# Patient Record
Sex: Female | Born: 1995 | Race: White | Hispanic: No | Marital: Single | Smoking: Never smoker
Health system: Southern US, Community
[De-identification: ages and names within clinical notes are randomized; demographics above are authoritative.]

## PROBLEM LIST (undated history)

## (undated) DIAGNOSIS — F988 Other specified behavioral and emotional disorders with onset usually occurring in childhood and adolescence: Secondary | ICD-10-CM

## (undated) HISTORY — DX: Other specified behavioral and emotional disorders with onset usually occurring in childhood and adolescence: F98.8

---

## 2015-05-12 ENCOUNTER — Other Ambulatory Visit: Payer: Self-pay | Admitting: Family Medicine

## 2015-05-12 ENCOUNTER — Ambulatory Visit
Admission: RE | Admit: 2015-05-12 | Discharge: 2015-05-12 | Disposition: A | Discharge status: Home / Self Care | Payer: BLUE CROSS/BLUE SHIELD | Source: Ambulatory Visit | Attending: Family Medicine | Admitting: Family Medicine

## 2015-05-12 DIAGNOSIS — M25552 Pain in left hip: Secondary | ICD-10-CM

## 2015-05-30 ENCOUNTER — Other Ambulatory Visit: Payer: Self-pay | Admitting: Family Medicine

## 2015-05-30 DIAGNOSIS — M25552 Pain in left hip: Secondary | ICD-10-CM

## 2015-06-05 ENCOUNTER — Ambulatory Visit
Admission: RE | Admit: 2015-06-05 | Discharge: 2015-06-05 | Disposition: A | Discharge status: Home / Self Care | Payer: BLUE CROSS/BLUE SHIELD | Source: Ambulatory Visit | Attending: Family Medicine | Admitting: Family Medicine

## 2015-06-05 DIAGNOSIS — S73192A Other sprain of left hip, initial encounter: Secondary | ICD-10-CM | POA: Diagnosis not present

## 2015-06-05 DIAGNOSIS — M25552 Pain in left hip: Secondary | ICD-10-CM | POA: Insufficient documentation

## 2015-06-05 DIAGNOSIS — Y9365 Activity, lacrosse and field hockey: Secondary | ICD-10-CM | POA: Insufficient documentation

## 2015-06-05 MED ORDER — IOPAMIDOL (ISOVUE-300) INJECTION 61%: 15.0000 mL | Freq: Once | INTRAVENOUS | Status: DC | PRN

## 2015-06-05 MED ORDER — GADOBENATE DIMEGLUMINE 529 MG/ML IV SOLN: 0.1000 mL | Freq: Once | INTRAVENOUS | Status: DC | PRN

## 2015-06-08 ENCOUNTER — Ambulatory Visit (INDEPENDENT_AMBULATORY_CARE_PROVIDER_SITE_OTHER): Payer: BLUE CROSS/BLUE SHIELD | Admitting: Family Medicine

## 2015-06-08 DIAGNOSIS — M25552 Pain in left hip: Secondary | ICD-10-CM

## 2015-06-16 NOTE — Progress Notes (Signed)
Patient ID: Marice PotterGracie Bursch, female   DOB: [birth year], 20 y.o.   MRN: [id number] Patient presents today with trainer to discuss results of her recent MR arthrogram. Patient states that her left hip is actually been feeling well. She has been able to participate and full capacity without having any significant pain. Trainer attests that she also has not been limited in practice or games.  ROS: Negative except mentioned above. Vitals as per Epic. GENERAL: NAD RESP: CTA B CARD: RRR MSK: L Hip-no tenderness to palpation, FROM, no significant hip flexor tightness, negative Faber, negative Fulcrum, negative log roll, nv intact  NEURO: CN II-XII grossly intact   A/P: L Hip Pain- MR arthrogram shows evidence of a labral tear, patient however is asymptomatic from this at this time and is able to participate without any problems. I've discussed the results with her and patient will proceed with seeing a hip specialist in the off season. If she has any return of symptoms regarding her hip she will follow-up with me and/or Dr. Ardine Engiehl.

## 2015-12-04 ENCOUNTER — Encounter: Payer: Self-pay | Admitting: Family Medicine

## 2015-12-04 ENCOUNTER — Ambulatory Visit (INDEPENDENT_AMBULATORY_CARE_PROVIDER_SITE_OTHER): Payer: Managed Care, Other (non HMO) | Admitting: Family Medicine

## 2015-12-04 VITALS — BP 92/62 | HR 96 | Temp 98.1°F | Resp 14

## 2015-12-04 DIAGNOSIS — R42 Dizziness and giddiness: Secondary | ICD-10-CM

## 2015-12-04 NOTE — Progress Notes (Signed)
Patient presents with symptoms of dizziness for a week. Patient denies any head injury. She denies CP, SOB, nausea, headache. Patient does admit that she did drink alcohol last weekend before the symptoms started on Monday and then again this past weekend. She has been practicing the whole week through the dizziness. More dizzy with standing up then lying down. Denies any syncopal episodes. She stopped taking her second dose of Adderall 10mg s because she thought it could be contributing. She denies any other illicit drug use, smoking. She tends to try to eat healthy and does not drink electrolyte beverages but takes electrolyte tablets and tries to drink plenty of water. Normal menstrual cycles.  ROS: Negative except mentioned above.  GENERAL: NAD HEENT: no pharyngeal erythema, no exudate, mild erythema of TMs, no bulging, no cervical LAD RESP: CTA B CARD: RRR NEURO: CN II-XII grossly intact, -Rombergs  A/P: Dizziness- EKG shows normal sinus rhythm with no significant abnormalities noted, urine pregnancy was negative, urine dip shows 2+ leukocytes will send for culture and some basic labs, she was orthostatic in the office but is able to tolerate oral fluids, encourage patient to stay out of the heat and not practice today and to rehydrate with water and electrolytes, would recommend seeing her back tomorrow and checking her vitals and symptoms again. If any new or worsening symptoms seek medical attention as discussed. No athletic activity for now. Discouraged drinking alcohol specially binge drinking. Patient denies having a problem with alcohol use.

## 2015-12-05 ENCOUNTER — Ambulatory Visit: Payer: Managed Care, Other (non HMO) | Admitting: Family Medicine

## 2015-12-05 LAB — CBC WITH DIFFERENTIAL/PLATELET
BASOS: 0 %
Basophils Absolute: 0 10*3/uL (ref 0.0–0.2)
EOS (ABSOLUTE): 0 10*3/uL (ref 0.0–0.4)
EOS: 0 %
HEMATOCRIT: 41.8 % (ref 34.0–46.6)
Hemoglobin: 13.8 g/dL (ref 11.1–15.9)
IMMATURE GRANS (ABS): 0 10*3/uL (ref 0.0–0.1)
IMMATURE GRANULOCYTES: 0 %
LYMPHS: 20 %
Lymphocytes Absolute: 1.7 10*3/uL (ref 0.7–3.1)
MCH: 31.2 pg (ref 26.6–33.0)
MCHC: 33 g/dL (ref 31.5–35.7)
MCV: 95 fL (ref 79–97)
Monocytes Absolute: 0.3 10*3/uL (ref 0.1–0.9)
Monocytes: 4 %
NEUTROS ABS: 6.3 10*3/uL (ref 1.4–7.0)
NEUTROS PCT: 76 %
PLATELETS: 287 10*3/uL (ref 150–379)
RBC: 4.42 x10E6/uL (ref 3.77–5.28)
RDW: 13.6 % (ref 12.3–15.4)
WBC: 8.4 10*3/uL (ref 3.4–10.8)

## 2015-12-05 LAB — TSH: TSH: 0.87 u[IU]/mL (ref 0.450–4.500)

## 2015-12-05 LAB — BASIC METABOLIC PANEL
BUN / CREAT RATIO: 14 (ref 9–23)
BUN: 12 mg/dL (ref 6–20)
CALCIUM: 9.4 mg/dL (ref 8.7–10.2)
CO2: 21 mmol/L (ref 18–29)
CREATININE: 0.83 mg/dL (ref 0.57–1.00)
Chloride: 99 mmol/L (ref 96–106)
GFR, EST AFRICAN AMERICAN: 117 mL/min/{1.73_m2} (ref >59)
GFR, EST NON AFRICAN AMERICAN: 102 mL/min/{1.73_m2} (ref >59)
Glucose: 139 mg/dL — ABNORMAL HIGH (ref 65–99)
Potassium: 4.2 mmol/L (ref 3.5–5.2)
Sodium: 137 mmol/L (ref 134–144)

## 2015-12-05 LAB — SPECIMEN STATUS REPORT

## 2015-12-08 LAB — POCT URINE PREGNANCY: PREG TEST UR: NEGATIVE

## 2016-04-12 ENCOUNTER — Encounter: Payer: Self-pay | Admitting: Family Medicine

## 2016-04-12 ENCOUNTER — Ambulatory Visit (INDEPENDENT_AMBULATORY_CARE_PROVIDER_SITE_OTHER): Payer: Managed Care, Other (non HMO) | Admitting: Family Medicine

## 2016-04-12 VITALS — BP 93/55 | HR 72 | Temp 98.0°F | Resp 14

## 2016-04-12 DIAGNOSIS — R21 Rash and other nonspecific skin eruption: Secondary | ICD-10-CM

## 2016-04-12 MED ORDER — PREDNISONE 20 MG PO TABS: ORAL_TABLET | ORAL | 0 refills | Status: AC

## 2016-04-12 NOTE — Progress Notes (Signed)
Patient presents today with symptoms of a erythematous rash on her entire body excluding her face. She states that the rash has been there for the last 2 days and has been itchy at times. She denies any itchiness in the webspaces of her fingers. She has used a new scrub on her body recently and has been taking a mushroom powder recently. She denies any other new medications. She denies any sore throat, fever, chest pain, shortness of breath, headache. She has not washed her clothes and any new detergents. She denies sleeping in any new beds. She denies any history of tick bites. She did take Claritin last night.  ROS: Negative except mentioned above. Vitals as per Epic. GENERAL: NAD HEENT: no pharyngeal erythema, no exudate, no erythema of TMs, no cervical LAD RESP: CTA B CARD: RRR SKIN: Very small erythematous slightly raised rash on extremities and torso area, also has some of the rash on her upper legs bilaterally, patient does have scabs on her hands from getting checked with Lacrosse, none of these are tender or look infected, no streaks appreciated from these areas, there is scarring on her lower extremities from bug bites in the past, none of these look infected NEURO: CN II-XII grossly intact   A/P: Skin rash - unsure of etiology however because the rash is itchy would recommend taking steroid and stopping mushroom powder and new scrub that was recently started, continue taking antihistamine such as Claritin or Zyrtec, wash any recently worn clothing and linens used. If symptoms do persist or worsen would recommend reevaluating and seeing if there is an infectious source. Patient addresses understanding of this and will seek medical attention if symptoms persist or worsen.

## 2016-09-06 IMAGING — MR MR HIP*L* W/CM
4 of 5 series · 22 of 40 positions shown · IV contrast (agent unspecified)
Comparison: None.

CLINICAL DATA: Left groin pain for 1 month. No known specific
injury.

EXAM:
MRI OF THE LEFT HIP WITH CONTRAST(MR Arthrogram)
TECHNIQUE: Multiplanar, multisequence MR imaging of the hip was performed
immediately following contrast injection into the hip joint under
fluoroscopic guidance. No intravenous contrast was administered.

[Series 3: T1 fat-sat · axial · 4.0mm · 0.39mm/px · z∈[-85,+21]mm · 9 of 25 slices shown (1 of 3)]
[im 1/25]
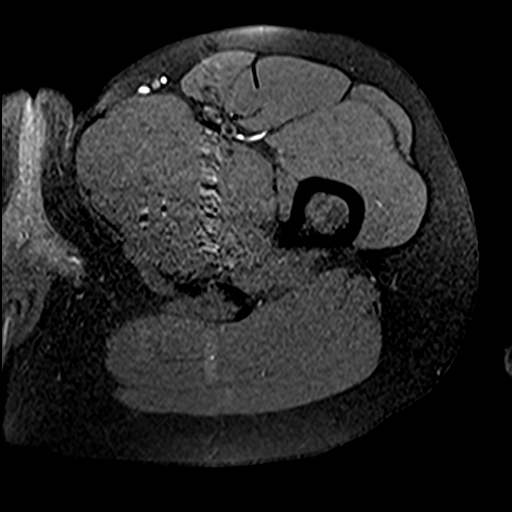
[im 4/25]
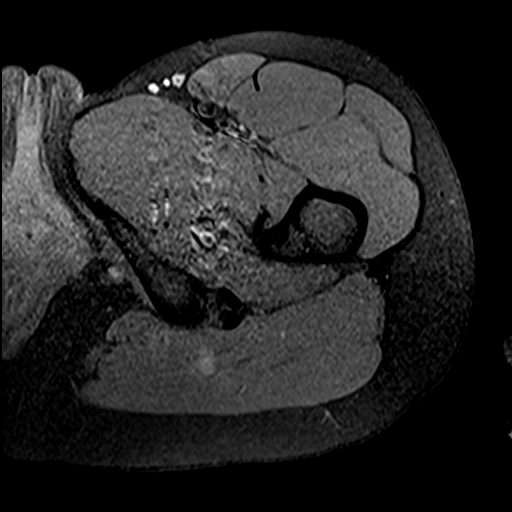
[im 7/25]
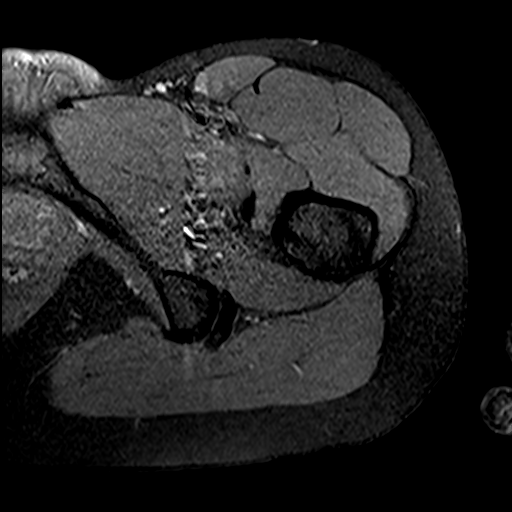
[im 10/25]
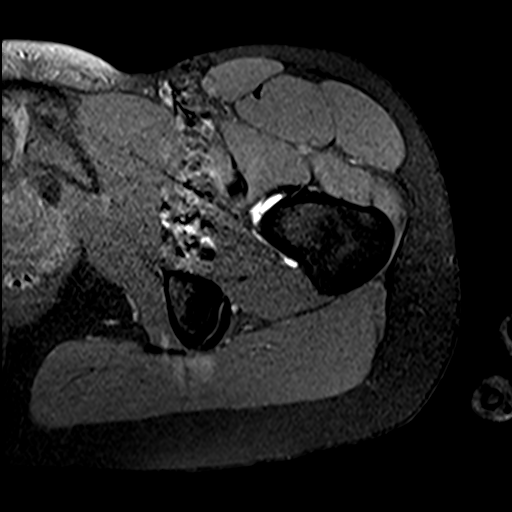
[im 13/25]
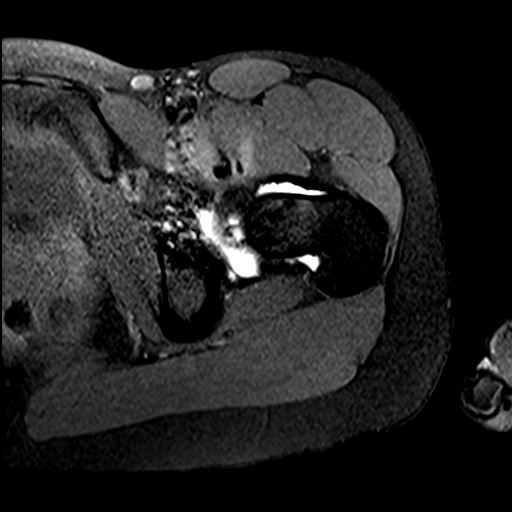
[im 16/25]
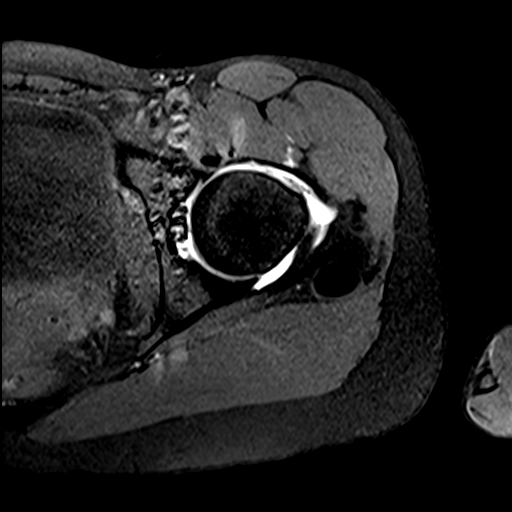
[im 19/25]
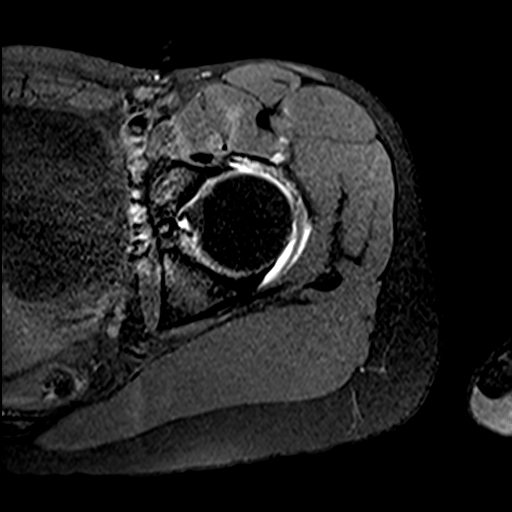
[im 22/25]
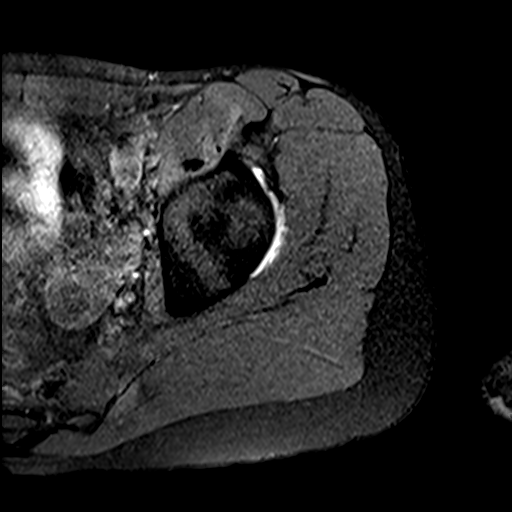
[im 25/25]
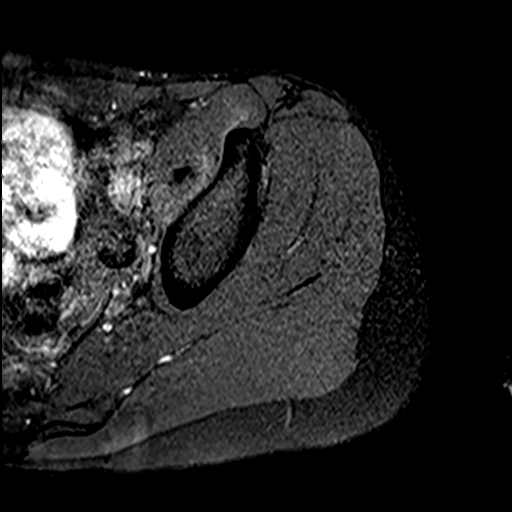

[Series 4: T1 fat-sat · coronal · 4.0mm · 0.39mm/px · 3 of 20 slices shown (2 of 3)]
[im 4/20]
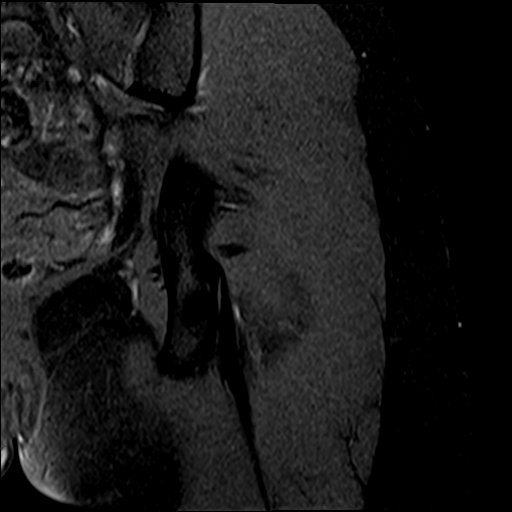
[im 10/20]
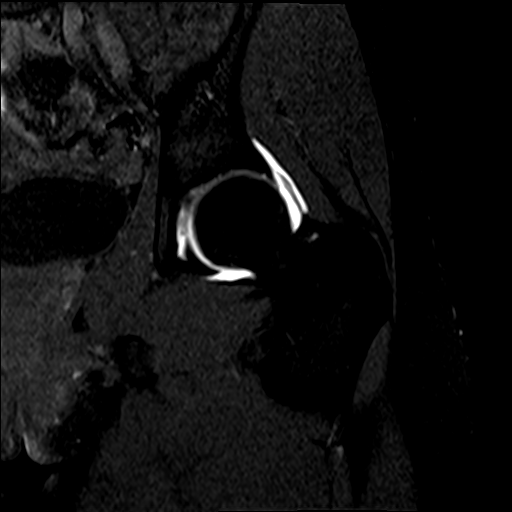
[im 16/20]
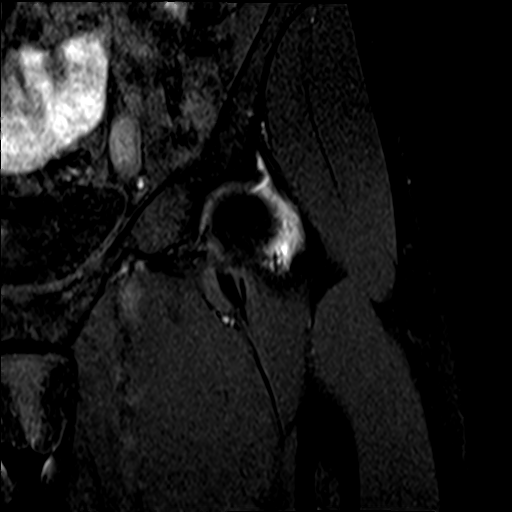

[Series 5: T1 fat-sat · sagittal · 4.0mm · 0.78mm/px · 3 of 23 slices shown (3 of 3)]
[im 4/23]
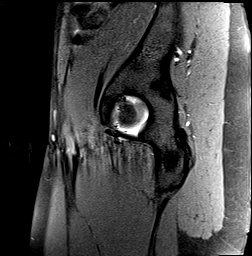
[im 13/23]
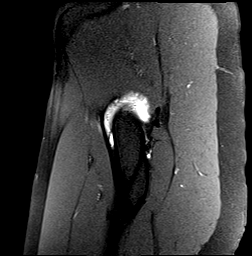
[im 19/23]
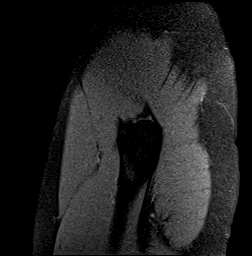

[Series 7: T2 fat-sat · coronal · 4.0mm · 0.68mm/px · 7 of 21 slices shown]
[im 1/21]
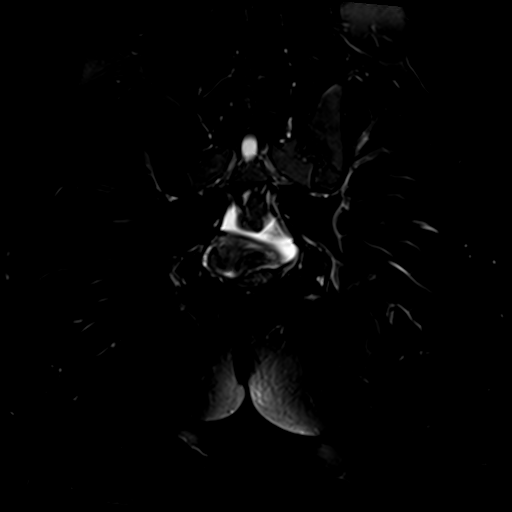
[im 4/21]
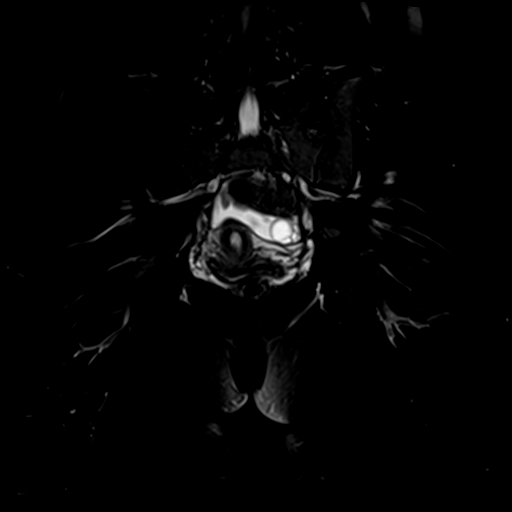
[im 7/21]
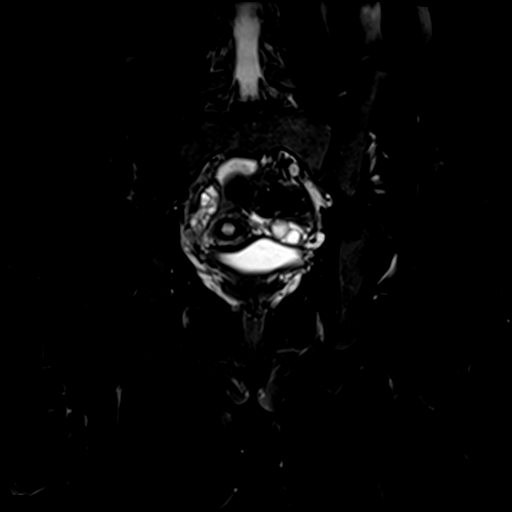
[im 11/21]
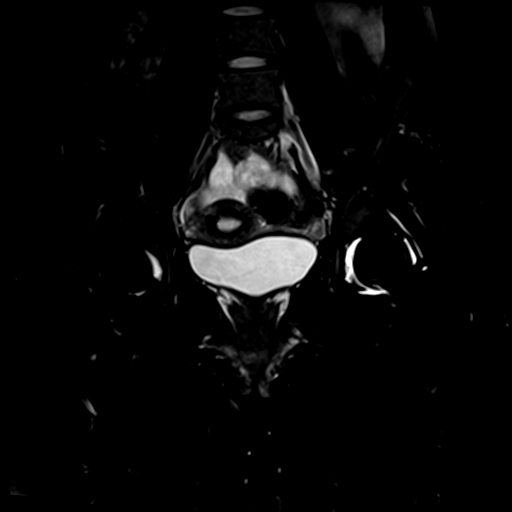
[im 14/21]
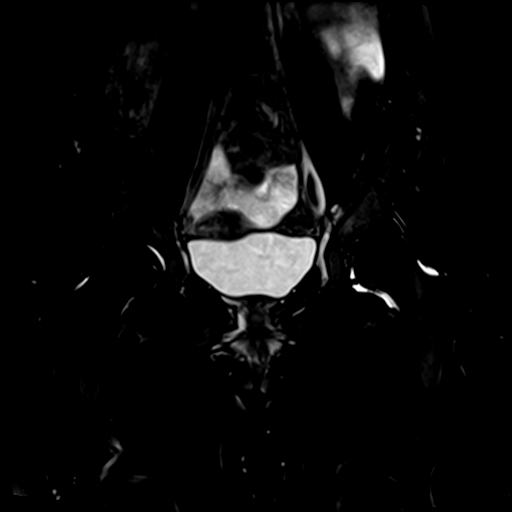
[im 17/21]
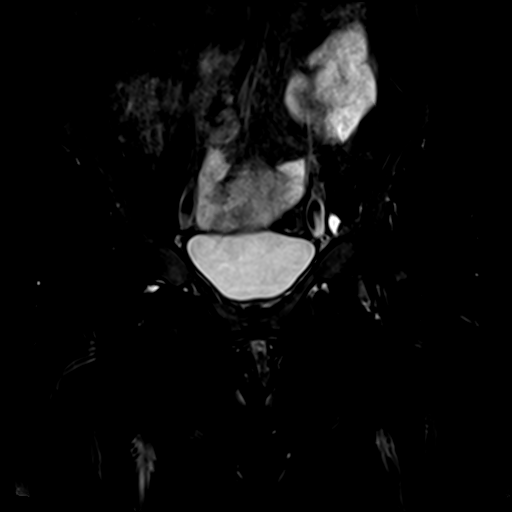
[im 21/21]
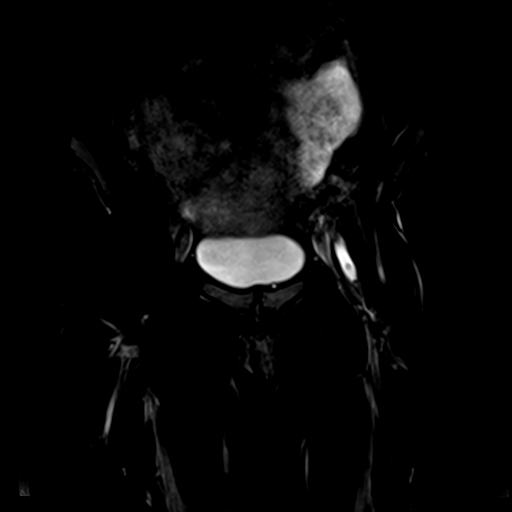

[22 of 40 positions shown; findings below may reference images not displayed]

FINDINGS: No acute bony findings. No stress fracture or AVN. Both hips are
normally located. No degenerative changes. The pubic symphysis and
SI joints are intact. The surrounding hip and pelvic musculature
appear normal. No muscle tear or myositis.

No significant intrapelvic abnormalities.

There is a superior anterior labral tear involving the left hip.
IMPRESSION: 1. Superior anterior labral tear involving the left hip.
2. No significant bony findings.

## 2016-09-06 IMAGING — RF DG ARTHROGRAM HIP*L*
1 series · 1 of 1 positions shown · non-contrast
Comparison: Left hip series of May 12, 2015

INDICATION: Left hip pain for the past 5 weeks possibly were related to a groin
strain while playing lacrosse, intermittent pain now.

EXAM:
ARTHROCENTESIS OF LEFT HIP JOINT UNDER FLUOROSCOPIC GUIDANCE

[Series 1: fluoro_iodine_singleshot_bw · 0.17mm/px · 1 of 1 slices shown]
[im 1/1]
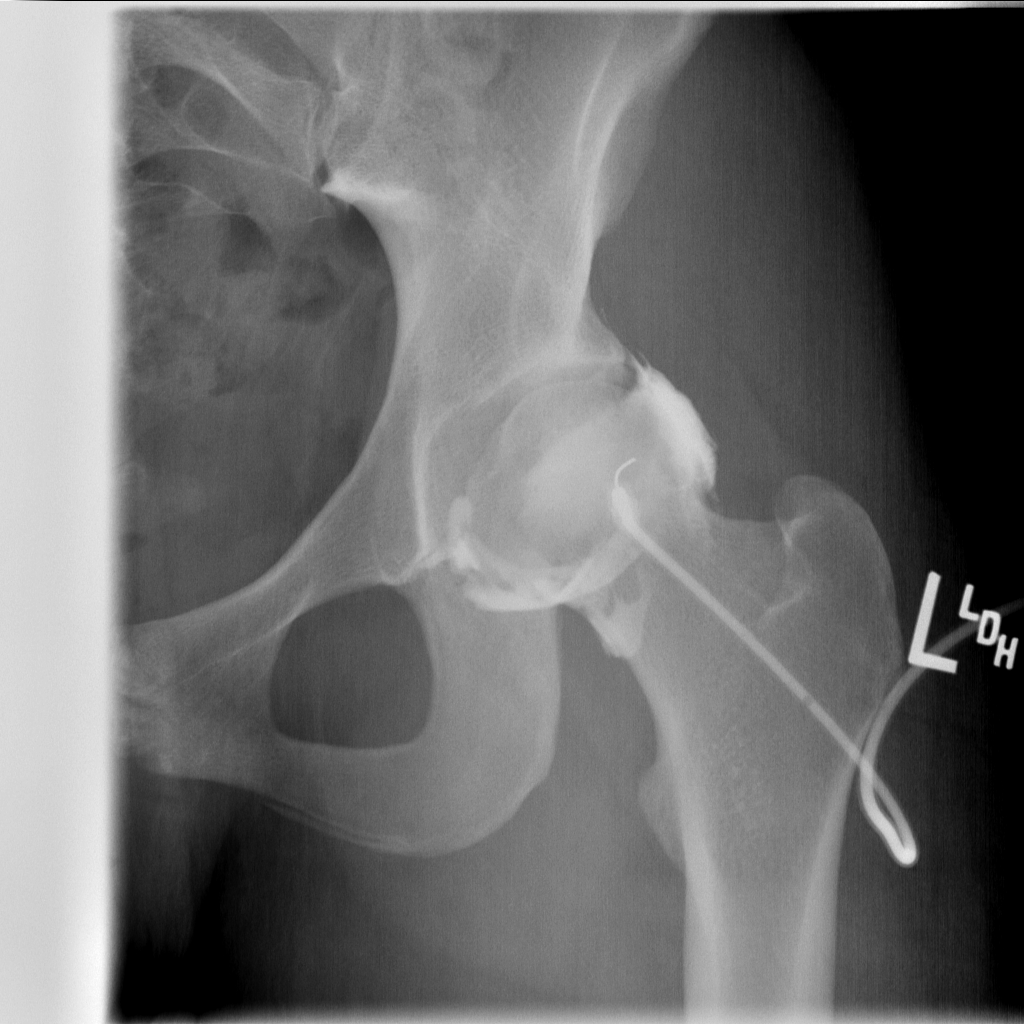

[1 of 1 positions shown; findings below may reference images not displayed]

FLUOROSCOPY TIME:  Fluoroscopy Time:  0 minutes, 36 seconds

Number of Acquired Images:  1

COMPLICATIONS:
None immediate.

PROCEDURE:
Informed written consent was obtained from the patient after
discussion of the risks. The patient's father was present behind the
radiation safety glass during the procedure. There was no
contraindication to the use of cutaneous antiseptic is, subcutaneous
lidocaine, and iodine or gadolinium based contrast agents.

The patient was placed supine on the fluoroscopy table and the left
hip was was positioned appropriately. The left hip was localized
with fluoroscopy. The skin overlying the anterior aspect of the left
hip was prepped and draped in usual sterile fashion. A 22 gauge
spinal needle was advanced into the left hip joint at the lateral
aspect of the femoral head-neck junction, after the overlying soft
tissues were anesthetized with 5 cc of 1% lidocaine. A test
injection was made confirming intra-articular positioning of the
needle tip. Subsequently 12 cc of a solution of 0.1 cc of
MultiHance, 15 cc of Omnipaque 300, and 5 cc of 1% lidocaine was
instilled into the joint. A fluoroscopic image was saved and sent to
PACs. The needle was withdrawn.

No bleeding was observed. A Band-Aid was applied. The patient
tolerated procedure well without immediate postprocedural
complication.
IMPRESSION: Successful fluoroscopic guided injection of the left hip prior to
MRI. The patient tolerated the procedure well. The patient was
encouraged to not practice lacrosse today but could resume her
practice schedule as tolerated tomorrow.

## 2017-01-10 ENCOUNTER — Ambulatory Visit (INDEPENDENT_AMBULATORY_CARE_PROVIDER_SITE_OTHER): Payer: Managed Care, Other (non HMO) | Admitting: Family Medicine

## 2017-01-10 VITALS — BP 97/64 | HR 67 | Temp 98.1°F

## 2017-01-10 DIAGNOSIS — R42 Dizziness and giddiness: Secondary | ICD-10-CM

## 2017-01-10 DIAGNOSIS — J069 Acute upper respiratory infection, unspecified: Secondary | ICD-10-CM

## 2017-01-10 LAB — POCT URINE PREGNANCY: Preg Test, Ur: NEGATIVE

## 2017-01-10 MED ORDER — MECLIZINE HCL 25 MG PO TABS: 25.0000 mg | ORAL_TABLET | Freq: Two times a day (BID) | ORAL | 0 refills | Status: AC | PRN

## 2017-01-10 MED ORDER — ONDANSETRON 4 MG PO TBDP: 4.0000 mg | ORAL_TABLET | Freq: Three times a day (TID) | ORAL | 0 refills | Status: AC | PRN

## 2017-01-10 NOTE — Progress Notes (Signed)
Patient presents today for symptoms of dizziness. Patient states that she has had the symptoms for a few days. She had similar symptoms last year that lasted for about a week. She states these symptoms are less severe. She has also had nasal congestion and coughing for about a week. She thinks her symptoms are related to the cold air and her ears. She denies any drainage from the ears. She denies any palpitations, diaphoresis, chest pain or shortness of breath associated with it.. She states the dizziness comes in waves and is not associated with exertion. At times she does notice it with turning her head. She does not notice it when she gets up from a seated to a standing position. She has noticed some urinary frequency. She denies any dysuria or vaginal discharge. Her last menstrual period was 2 weeks ago. She is sexually active. Patient does admit to drinking caffeine. She has been drinking plenty of water ago she states. Typically does have low blood pressure.  ROS: Negative except mentioned above. Vitals as per Epic GENERAL: NAD HEENT: no pharyngeal erythema, no exudate, no erythema of TMs, no cervical LAD RESP: CTA B CARD: RRR  ABD: +BS, NT, no rebound or guarding  NEURO: CN II-XII grossly intact   Urine dip - trace leukocytes, negative nitrites, negative blood, negative protein, negative ketones, negative glucose  Urine pregnancy negative  A/P: Dizziness - does not appear to be related to orthostasis, may be related to current URI, will treat with Meclizine, rest, hydration, advance activity as tolerated, seek medical attention if symptoms persist or worsen as discussed. Will send urine for culture.

## 2017-01-12 LAB — URINE CULTURE: ORGANISM ID, BACTERIA: NO GROWTH

## 2017-01-16 ENCOUNTER — Other Ambulatory Visit: Payer: Self-pay | Admitting: Family Medicine

## 2017-01-16 DIAGNOSIS — R42 Dizziness and giddiness: Secondary | ICD-10-CM

## 2017-05-06 ENCOUNTER — Emergency Department
Admission: EM | Admit: 2017-05-06 | Discharge: 2017-05-07 | Disposition: A | Discharge status: Home / Self Care | Payer: Managed Care, Other (non HMO) | Attending: Emergency Medicine | Admitting: Emergency Medicine

## 2017-05-06 ENCOUNTER — Ambulatory Visit
Admission: RE | Admit: 2017-05-06 | Discharge: 2017-05-06 | Disposition: A | Discharge status: Home / Self Care | Payer: Managed Care, Other (non HMO) | Source: Ambulatory Visit | Attending: Family Medicine | Admitting: Family Medicine

## 2017-05-06 ENCOUNTER — Other Ambulatory Visit: Payer: Self-pay

## 2017-05-06 ENCOUNTER — Other Ambulatory Visit: Payer: Self-pay | Admitting: Family Medicine

## 2017-05-06 ENCOUNTER — Encounter: Payer: Self-pay | Admitting: Emergency Medicine

## 2017-05-06 ENCOUNTER — Emergency Department: Payer: Managed Care, Other (non HMO)

## 2017-05-06 DIAGNOSIS — Y9365 Activity, lacrosse and field hockey: Secondary | ICD-10-CM | POA: Insufficient documentation

## 2017-05-06 DIAGNOSIS — Y92328 Other athletic field as the place of occurrence of the external cause: Secondary | ICD-10-CM | POA: Insufficient documentation

## 2017-05-06 DIAGNOSIS — R52 Pain, unspecified: Secondary | ICD-10-CM

## 2017-05-06 DIAGNOSIS — M25511 Pain in right shoulder: Secondary | ICD-10-CM | POA: Insufficient documentation

## 2017-05-06 DIAGNOSIS — S4991XA Unspecified injury of right shoulder and upper arm, initial encounter: Secondary | ICD-10-CM | POA: Diagnosis present

## 2017-05-06 DIAGNOSIS — Y998 Other external cause status: Secondary | ICD-10-CM | POA: Diagnosis not present

## 2017-05-06 DIAGNOSIS — S40011A Contusion of right shoulder, initial encounter: Secondary | ICD-10-CM | POA: Insufficient documentation

## 2017-05-06 DIAGNOSIS — R9389 Abnormal findings on diagnostic imaging of other specified body structures: Secondary | ICD-10-CM | POA: Insufficient documentation

## 2017-05-06 DIAGNOSIS — Z79899 Other long term (current) drug therapy: Secondary | ICD-10-CM | POA: Insufficient documentation

## 2017-05-06 DIAGNOSIS — F909 Attention-deficit hyperactivity disorder, unspecified type: Secondary | ICD-10-CM | POA: Insufficient documentation

## 2017-05-06 DIAGNOSIS — W2109XA Struck by other hit or thrown ball, initial encounter: Secondary | ICD-10-CM | POA: Insufficient documentation

## 2017-05-06 DIAGNOSIS — T148XXA Other injury of unspecified body region, initial encounter: Secondary | ICD-10-CM

## 2017-05-06 MED ORDER — MELOXICAM 15 MG PO TABS: 15.0000 mg | ORAL_TABLET | Freq: Every day | ORAL | 0 refills | Status: AC

## 2017-05-06 MED ORDER — KETOROLAC TROMETHAMINE 30 MG/ML IJ SOLN
30.0000 mg | Freq: Once | INTRAMUSCULAR | Status: AC
  Administered 2017-05-06: 30 mg via INTRAMUSCULAR
  Filled 2017-05-06: qty 1

## 2017-05-06 NOTE — ED Triage Notes (Addendum)
Patient ambulatory to triage with steady gait, without difficulty or distress noted; pt reports hit with lacrosse ball to right clavicle yesterday; had xray at urgent care at 3pm but "radiologist needs to look at it"; pt came here due to pain; imaging shown under results

## 2017-05-06 NOTE — ED Notes (Addendum)
Pt to the er for pain to the right clavicle. Increased pain today. Hx of Erb's palsy. Pt has hx of clavicle fx. Pt got an xray at 1500. Pt states pain comes in waves along the front of the clavicle into the neck.

## 2017-05-06 NOTE — ED Provider Notes (Signed)
[facility] Emergency Department Provider Note  ____________________________________________  Time seen: Approximately 10:12 PM  I have reviewed the triage vital signs and the nursing notes.   HISTORY  Chief Complaint Clavicle Injury    HPI Rhonda Andersen is a 22 y.o. female who presents the emergency department complaining of right clavicular pain.  Patient was playing lacrosse when 1 of the ball was hit her along her clavicle.  Patient reports that she had a clavicle fracture during the birthing process.  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  Patient reports that yesterday she was struck in the shoulder with a cross ball.  She states that it felt like a bruise and did not think anything of it.  She tried to return to play today and had increasing pain.  Patient was evaluated by team Dr. with x-ray.  It had not been read by radiologist and patient states that her pain was increasing.  Patient was concerned that she may have a fracture in her clavicle region.  She has had a slight limited range of motion due to pain.  She denies any increase in neuropathy.  No loss of sensation in the right upper extremity.  No other injury or complaint at this time.  Past Medical History:  Diagnosis Date  . ADD (attention deficit disorder) 2014-2015    There are no active problems to display for this patient.   History reviewed. No pertinent surgical history.  Prior to Admission medications   Medication Sig Start Date End Date Taking? Authorizing Provider  amphetamine-dextroamphetamine (ADDERALL) 10 MG tablet Take 10 mg by mouth 2 (two) times daily with a meal.    [provider]  meclizine (ANTIVERT) 25 MG tablet Take 1 tablet (25 mg total) by mouth 2 (two) times daily as needed for dizziness. 01/10/17   Jolene Provost, MD  meloxicam (MOBIC) 15 MG tablet Take 1 tablet (15 mg total) by mouth daily. 05/06/17   Cuthriell, Delorise Royals, PA-C   ondansetron (ZOFRAN ODT) 4 MG disintegrating tablet Take 1 tablet (4 mg total) by mouth every 8 (eight) hours as needed for nausea or vomiting. 01/10/17   Jolene Provost, MD  predniSONE (DELTASONE) 20 MG tablet Take 2 tabs PO once daily for 4 days. 04/12/16   Jolene Provost, MD    Allergies Patient has no known allergies.  No family history on file.  Social History Social History   Tobacco Use  . Smoking status: Never Smoker  . Smokeless tobacco: Never Used  Substance Use Topics  . Alcohol use: Yes  . Drug use: Not on file     Review of Systems  Constitutional: No fever/chills Eyes: No visual changes. Cardiovascular: no chest pain. Respiratory: no cough. No SOB. Gastrointestinal: No abdominal pain.  No nausea, no vomiting.   Musculoskeletal: Positive for right clavicular pain Skin: Negative for rash, abrasions, lacerations, ecchymosis. Neurological: Negative for headaches, focal weakness or numbness. 10-point ROS otherwise negative.  ____________________________________________   PHYSICAL EXAM:  VITAL SIGNS: ED Triage Vitals  Enc Vitals Group     BP 05/06/17 2101 105/69     Pulse Rate 05/06/17 2101 60     Resp 05/06/17 2101 18     Temp 05/06/17 2101 98.2 F (36.8 C)     Temp Source 05/06/17 2101 Oral     SpO2 05/06/17 2101 98 %     Weight 05/06/17 2102 112 lb (50.8 kg)     Height 05/06/17 2102 5'  4" (1.626 m)     Head Circumference --      Peak Flow --      Pain Score 05/06/17 2102 8     Pain Loc --      Pain Edu? --      Excl. in GC? --      Constitutional: Alert and oriented. Well appearing and in no acute distress. Eyes: Conjunctivae are normal. PERRL. EOMI. Head: Atraumatic. Neck: No stridor.  No cervical spine tenderness to palpation.  Cardiovascular: Normal rate, regular rhythm. Normal S1 and S2.  Good peripheral circulation. Respiratory: Normal respiratory effort without tachypnea or retractions. Lungs CTAB. Good air entry to the bases with no  decreased or absent breath sounds. Musculoskeletal: Full range of motion to all extremities. No gross deformities appreciated.  No visible deformity, ecchymosis, lacerations, abrasions, edema noted to the right anterior shoulder.  Patient is tender to palpation midshaft clavicle with no palpable abnormality.  No tenderness to palpation over the acromioclavicular joint.  No other tenderness to palpation over the osseous structures of the right shoulder.  Limited range of motion due to pain.  Full passive range of motion.  Examination of the cervical spine and elbows unremarkable.  Radial pulse intact distally.  Sensation intact all 5 digits distally. Neurologic:  Normal speech and language. No gross focal neurologic deficits are appreciated.  Skin:  Skin is warm, dry and intact. No rash noted. Psychiatric: Mood and affect are normal. Speech and behavior are normal. Patient exhibits appropriate insight and judgement.   ____________________________________________   LABS (all labs ordered are listed, but only abnormal results are displayed)  Labs Reviewed - No data to display ____________________________________________  EKG   ____________________________________________  RADIOLOGY  Outpatient imaging on this date is reviewed.  Radiologist reviews imaging with no acute Clavicular fracture.  Possible acromioclavicular joint space increase.  Personal review of imaging, no acute fractures.  No significant medial clavicular joint space interval increase.  No previous imaging available for comparison.  Dg Clavicle Right  Result Date: 05/06/2017 CLINICAL DATA:  22 y/o F; right anterior clavicle pain after blunt trauma. EXAM: RIGHT CLAVICLE - 2+ VIEWS COMPARISON:  None. FINDINGS: No acute fracture. Soft tissues are unremarkable. Borderline acromioclavicular interval. Normal coracoclavicular interval. IMPRESSION: Borderline acromioclavicular interval may represent joint injury. Normal coracoclavicular  interval. No acute fracture. Electronically Signed   By: Mitzi Hansen M.D.   On: 05/06/2017 23:04    ____________________________________________    PROCEDURES  Procedure(s) performed:    Procedures    Medications  ketorolac (TORADOL) 30 MG/ML injection 30 mg (not administered)     ____________________________________________   INITIAL IMPRESSION / ASSESSMENT AND PLAN / ED COURSE  Pertinent labs & imaging results that were available during my care of the patient were reviewed by me and considered in my medical decision making (see chart for details).  Review of the [place] CSRS was performed in accordance of the [facility] prior to dispensing any controlled drugs.     Patient's diagnosis is consistent with a clavicle contusion.  Patient presented to the emergency department with increasing clavicle pain after being struck by a lacrosse ball 2 days ago.  Patient was evaluated by team doctor, x-ray ordered.  At time of arrival, imaging had no results from radiology.  Another imaging order was placed for right clavicle.  However, radiologist was able to read previous x-ray with results detailed above.  No acute fractures of the clavicle is identified.  Possible acromioclavicular interval increase per radiologist.  Patient is nontender to palpation over the acromioclavicular joint.  I do not suspect shoulder separation at this time.  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  At this time, I suspect based off the patient's history, physical exam, radiological findings, the patient has contusion of the clavicle.  Patient will be given injection of Toradol in the emergency department for symptom control.  Patient will be discharged home with prescriptions for meloxicam for symptom improvement.. Patient is to follow up with team physician for return to sports clearance for further  imaging/referral to orthopedics as needed or otherwise directed. Patient is given ED precautions to return to the ED for any worsening or new symptoms.     ____________________________________________  FINAL CLINICAL IMPRESSION(S) / ED DIAGNOSES  Final diagnoses:  Contusion of right clavicle, initial encounter      NEW MEDICATIONS STARTED DURING THIS VISIT:  ED Discharge Orders        Ordered    meloxicam (MOBIC) 15 MG tablet  Daily     05/06/17 2339          This chart was dictated using voice recognition software/Dragon. Despite best efforts to proofread, errors can occur which can change the meaning. Any change was purely unintentional.    Racheal PatchesCuthriell, Jonathan D, PA-C 05/06/17 2341    Nita SickleVeronese, [place], MD 05/06/17 [phone]

## 2017-05-08 ENCOUNTER — Other Ambulatory Visit: Payer: Self-pay | Admitting: Family Medicine

## 2017-05-08 MED ORDER — CYCLOBENZAPRINE HCL 5 MG PO TABS: 5.0000 mg | ORAL_TABLET | Freq: Two times a day (BID) | ORAL | 0 refills | Status: AC | PRN

## 2017-05-09 ENCOUNTER — Other Ambulatory Visit: Payer: Self-pay | Admitting: Neurology

## 2017-05-10 ENCOUNTER — Ambulatory Visit
Admission: RE | Admit: 2017-05-10 | Discharge: 2017-05-10 | Disposition: A | Discharge status: Home / Self Care | Payer: Managed Care, Other (non HMO) | Source: Ambulatory Visit | Attending: Neurology | Admitting: Neurology

## 2017-05-10 DIAGNOSIS — X58XXXA Exposure to other specified factors, initial encounter: Secondary | ICD-10-CM | POA: Insufficient documentation

## 2017-05-10 DIAGNOSIS — S42001A Fracture of unspecified part of right clavicle, initial encounter for closed fracture: Secondary | ICD-10-CM | POA: Diagnosis not present

## 2017-05-10 MED ORDER — GADOBENATE DIMEGLUMINE 529 MG/ML IV SOLN
10.0000 mL | Freq: Once | INTRAVENOUS | Status: AC | PRN
  Administered 2017-05-10: 10 mL via INTRAVENOUS

## 2018-10-10 ENCOUNTER — Ambulatory Visit: Admit: 2018-10-10 | Payer: HMO

## 2018-10-10 ENCOUNTER — Ambulatory Visit: Admitting: Infectious Disease

## 2018-10-11 LAB — HX MICRO-RESP VIRAL PANEL: HX COVID-19 (SARS-COV-2): NEGATIVE

## 2020-10-15 ENCOUNTER — Encounter

## 2020-10-16 ENCOUNTER — Ambulatory Visit (HOSPITAL_BASED_OUTPATIENT_CLINIC_OR_DEPARTMENT_OTHER): Admitting: Ophthalmology

## 2020-10-16 NOTE — Progress Notes (Deleted)
 Pre-charting 10/16/20  Here for stye I&D  No prior history
